# Patient Record
Sex: Female | Born: 1975 | Race: White | Hispanic: No | Marital: Married | Smoking: Former smoker
Health system: Southern US, Community
[De-identification: ages and names within clinical notes are randomized; demographics above are authoritative.]

## PROBLEM LIST (undated history)

## (undated) DIAGNOSIS — F329 Major depressive disorder, single episode, unspecified: Secondary | ICD-10-CM

## (undated) DIAGNOSIS — F32A Depression, unspecified: Secondary | ICD-10-CM

## (undated) DIAGNOSIS — I10 Essential (primary) hypertension: Secondary | ICD-10-CM

## (undated) DIAGNOSIS — E785 Hyperlipidemia, unspecified: Secondary | ICD-10-CM

## (undated) HISTORY — PX: TUBAL LIGATION: SHX77

## (undated) HISTORY — PX: CHOLECYSTECTOMY: SHX55

---

## 2009-05-24 ENCOUNTER — Emergency Department (HOSPITAL_COMMUNITY): Admission: EM | Admit: 2009-05-24 | Discharge: 2009-05-24 | Payer: Self-pay | Admitting: Emergency Medicine

## 2009-05-26 ENCOUNTER — Emergency Department (HOSPITAL_COMMUNITY): Admission: EM | Admit: 2009-05-26 | Discharge: 2009-05-26 | Payer: Self-pay | Admitting: Emergency Medicine

## 2009-12-21 ENCOUNTER — Emergency Department (HOSPITAL_COMMUNITY): Admission: EM | Admit: 2009-12-21 | Discharge: 2009-12-21 | Payer: Self-pay | Admitting: Emergency Medicine

## 2010-01-29 ENCOUNTER — Emergency Department (HOSPITAL_COMMUNITY): Admission: EM | Admit: 2010-01-29 | Discharge: 2010-01-29 | Payer: Self-pay | Admitting: Emergency Medicine

## 2010-03-09 ENCOUNTER — Emergency Department (HOSPITAL_COMMUNITY): Admission: EM | Admit: 2010-03-09 | Discharge: 2010-03-09 | Payer: Self-pay | Admitting: Emergency Medicine

## 2010-04-16 ENCOUNTER — Emergency Department (HOSPITAL_COMMUNITY)
Admission: EM | Admit: 2010-04-16 | Discharge: 2010-04-16 | Payer: Self-pay | Source: Home / Self Care | Admitting: Emergency Medicine

## 2010-04-25 LAB — BASIC METABOLIC PANEL
BUN: 9 mg/dL (ref 6–23)
CO2: 27 mEq/L (ref 19–32)
Calcium: 9.8 mg/dL (ref 8.4–10.5)
Chloride: 102 mEq/L (ref 96–112)
Creatinine, Ser: 0.86 mg/dL (ref 0.4–1.2)
GFR calc Af Amer: >60 mL/min (ref >60)
GFR calc non Af Amer: >60 mL/min (ref >60)
Glucose, Bld: 117 mg/dL — ABNORMAL HIGH (ref 70–99)
Potassium: 4.2 mEq/L (ref 3.5–5.1)
Sodium: 138 mEq/L (ref 135–145)

## 2010-04-25 LAB — URINALYSIS, ROUTINE W REFLEX MICROSCOPIC
Bilirubin Urine: NEGATIVE
Ketones, ur: NEGATIVE mg/dL
Leukocytes, UA: NEGATIVE
Nitrite: NEGATIVE
Protein, ur: NEGATIVE mg/dL
Specific Gravity, Urine: 1.01 (ref 1.005–1.030)
Urine Glucose, Fasting: NEGATIVE mg/dL
Urobilinogen, UA: 0.2 mg/dL (ref 0.0–1.0)
pH: 5.5 (ref 5.0–8.0)

## 2010-04-25 LAB — DIFFERENTIAL
Basophils Absolute: 0 10*3/uL (ref 0.0–0.1)
Basophils Relative: 0 % (ref 0–1)
Eosinophils Absolute: 0.4 10*3/uL (ref 0.0–0.7)
Eosinophils Relative: 4 % (ref 0–5)
Lymphocytes Relative: 30 % (ref 12–46)
Lymphs Abs: 3.3 10*3/uL (ref 0.7–4.0)
Monocytes Absolute: 0.6 10*3/uL (ref 0.1–1.0)
Monocytes Relative: 6 % (ref 3–12)
Neutro Abs: 6.9 10*3/uL (ref 1.7–7.7)
Neutrophils Relative %: 61 % (ref 43–77)

## 2010-04-25 LAB — CBC
HCT: 42.8 % (ref 36.0–46.0)
Hemoglobin: 14.1 g/dL (ref 12.0–15.0)
MCH: 29.5 pg (ref 26.0–34.0)
MCHC: 32.9 g/dL (ref 30.0–36.0)
MCV: 89.5 fL (ref 78.0–100.0)
Platelets: 231 10*3/uL (ref 150–400)
RBC: 4.78 MIL/uL (ref 3.87–5.11)
RDW: 12.7 % (ref 11.5–15.5)
WBC: 11.2 10*3/uL — ABNORMAL HIGH (ref 4.0–10.5)

## 2010-04-25 LAB — URINE MICROSCOPIC-ADD ON

## 2010-06-30 LAB — GLUCOSE, CAPILLARY: Glucose-Capillary: 190 mg/dL — ABNORMAL HIGH (ref 70–99)

## 2010-09-25 ENCOUNTER — Emergency Department (HOSPITAL_COMMUNITY)
Admission: EM | Admit: 2010-09-25 | Discharge: 2010-09-26 | Disposition: A | Discharge status: Home / Self Care | Payer: Managed Care, Other (non HMO) | Attending: Emergency Medicine | Admitting: Emergency Medicine

## 2010-09-25 DIAGNOSIS — K029 Dental caries, unspecified: Secondary | ICD-10-CM | POA: Insufficient documentation

## 2010-09-25 DIAGNOSIS — K089 Disorder of teeth and supporting structures, unspecified: Secondary | ICD-10-CM | POA: Insufficient documentation

## 2010-09-25 DIAGNOSIS — E119 Type 2 diabetes mellitus without complications: Secondary | ICD-10-CM | POA: Insufficient documentation

## 2010-12-25 ENCOUNTER — Emergency Department (HOSPITAL_COMMUNITY)
Admission: EM | Admit: 2010-12-25 | Discharge: 2010-12-25 | Disposition: A | Discharge status: Home / Self Care | Payer: Managed Care, Other (non HMO) | Attending: Emergency Medicine | Admitting: Emergency Medicine

## 2010-12-25 DIAGNOSIS — E119 Type 2 diabetes mellitus without complications: Secondary | ICD-10-CM | POA: Insufficient documentation

## 2010-12-25 DIAGNOSIS — Z79899 Other long term (current) drug therapy: Secondary | ICD-10-CM | POA: Insufficient documentation

## 2010-12-25 DIAGNOSIS — I10 Essential (primary) hypertension: Secondary | ICD-10-CM | POA: Insufficient documentation

## 2010-12-25 DIAGNOSIS — R51 Headache: Secondary | ICD-10-CM | POA: Insufficient documentation

## 2010-12-25 LAB — GLUCOSE, CAPILLARY: Glucose-Capillary: 190 mg/dL — ABNORMAL HIGH (ref 70–99)

## 2011-08-09 ENCOUNTER — Emergency Department (HOSPITAL_COMMUNITY)
Admission: EM | Admit: 2011-08-09 | Discharge: 2011-08-09 | Disposition: A | Discharge status: Home / Self Care | Payer: Managed Care, Other (non HMO) | Attending: Emergency Medicine | Admitting: Emergency Medicine

## 2011-08-09 ENCOUNTER — Encounter (HOSPITAL_COMMUNITY): Payer: Self-pay

## 2011-08-09 DIAGNOSIS — R21 Rash and other nonspecific skin eruption: Secondary | ICD-10-CM

## 2011-08-09 HISTORY — DX: Depression, unspecified: F32.A

## 2011-08-09 HISTORY — DX: Major depressive disorder, single episode, unspecified: F32.9

## 2011-08-09 MED ORDER — PREDNISONE (PAK) 10 MG PO TABS: 10.0000 mg | ORAL_TABLET | Freq: Every day | ORAL | Status: AC

## 2011-08-09 MED ORDER — FAMOTIDINE 20 MG PO TABS: 40.0000 mg | ORAL_TABLET | Freq: Two times a day (BID) | ORAL | Status: DC

## 2011-08-09 MED ORDER — TETANUS-DIPHTH-ACELL PERTUSSIS 5-2.5-18.5 LF-MCG/0.5 IM SUSP
0.5000 mL | Freq: Once | INTRAMUSCULAR | Status: AC
  Administered 2011-08-09: 0.5 mL via INTRAMUSCULAR
  Filled 2011-08-09: qty 0.5

## 2011-08-09 MED ORDER — SULFAMETHOXAZOLE-TRIMETHOPRIM 800-160 MG PO TABS: 1.0000 | ORAL_TABLET | Freq: Two times a day (BID) | ORAL | Status: AC

## 2011-08-09 MED ORDER — FAMOTIDINE 20 MG PO TABS
40.0000 mg | ORAL_TABLET | Freq: Once | ORAL | Status: AC
  Administered 2011-08-09: 40 mg via ORAL
  Filled 2011-08-09: qty 2

## 2011-08-09 MED ORDER — DIPHENHYDRAMINE HCL 25 MG PO CAPS
50.0000 mg | ORAL_CAPSULE | Freq: Once | ORAL | Status: AC
  Administered 2011-08-09: 50 mg via ORAL
  Filled 2011-08-09: qty 2

## 2011-08-09 MED ORDER — PREDNISONE 20 MG PO TABS
60.0000 mg | ORAL_TABLET | Freq: Once | ORAL | Status: AC
  Administered 2011-08-09: 60 mg via ORAL
  Filled 2011-08-09: qty 3

## 2011-08-09 MED ORDER — DIPHENHYDRAMINE HCL 25 MG PO TABS: 25.0000 mg | ORAL_TABLET | Freq: Four times a day (QID) | ORAL | Status: DC

## 2011-08-09 NOTE — ED Provider Notes (Signed)
Medical screening examination/treatment/procedure(s) were performed by non-physician practitioner and as supervising physician I was immediately available for consultation/collaboration.  Meital Riehl, MD 08/09/11 2349 

## 2011-08-09 NOTE — ED Provider Notes (Signed)
History     CSN: [id number]  Arrival date & time 08/09/11  1851   First MD Initiated Contact with Patient 08/09/11 2113      Chief Complaint  Patient presents with  . Urticaria    (Consider location/radiation/quality/duration/timing/severity/associated sxs/prior treatment) HPI  36 year old female presents with complaints of urticarial rash.  Pt sts for the past 5 days she has been developing and red itchy rash to both of her arms.  Describe onset as gradual, persistent, and now spreading towards upper chest.  Denies any changes in activity, medication, detergents, travels, or swimming in hot tub.  Itch improves with benadryl.  Denies fever, headache, sore throat, trouble breathing, sob, n/v/d, abd pain.  Has had chicken pox in the past.  UTD with all immunization.  No recent travel.    Past Medical History  Diagnosis Date  . Diabetes mellitus   . Depression     Past Surgical History  Procedure Date  . Cholecystectomy     No family history on file.  History  Substance Use Topics  . Smoking status: Current Everyday Smoker  . Smokeless tobacco: Not on file  . Alcohol Use: No    OB History    Grav Para Term Preterm Abortions TAB SAB Ect Mult Living                  Review of Systems  All other systems reviewed and are negative.    Allergies  Penicillins  Home Medications   Current Outpatient Rx  Name Route Sig Dispense Refill  . DIPHENHYDRAMINE HCL 25 MG PO CAPS Oral Take 100 mg by mouth every 6 (six) hours as needed. For allergy    . IBUPROFEN 200 MG PO TABS Oral Take 800 mg by mouth every 6 (six) hours as needed. For pain    . SERTRALINE HCL 100 MG PO TABS Oral Take 100 mg by mouth at bedtime.      BP 150/93  Pulse 104  Temp(Src) 98.3 F (36.8 C) (Oral)  Resp 18  SpO2 97%  LMP 08/09/2011  Physical Exam  Constitutional: She appears well-developed and well-nourished. No distress.  HENT:  Head: Atraumatic.  Mouth/Throat: Oropharynx is clear and  moist.  Eyes: Conjunctivae are normal.  Neck: Neck supple.  Cardiovascular: Normal rate and regular rhythm.   Pulmonary/Chest: Effort normal. No respiratory distress. She has no wheezes. She exhibits no tenderness.  Abdominal: Soft. There is no tenderness.  Lymphadenopathy:    She has no cervical adenopathy.  Neurological: She is alert.  Skin: Skin is warm.       Multiple maculopapular rash throughout dorsum of arms bilaterally and upper chest.  Nonpetechiae, nonpustular, nonvesicular.  Blanchable.    Psychiatric: She has a normal mood and affect.    ED Course  Procedures (including critical care time)  Labs Reviewed - No data to display No results found.   No diagnosis found.    MDM  Rash of unknown origin.  No systemic involvement.  Plan to give bactrim to prevent secondary skin infection, prednisone/benadryl/pepcid for symptomatic treatment.  Referral to dermatology.  Pt voice understanding.  Tdap given.          Fayrene Helper, PA-C 08/09/11 2127

## 2011-08-09 NOTE — Discharge Instructions (Signed)
Rash A rash is a change in the color or texture of your skin. There are many different types of rashes. You may have other problems that accompany your rash. CAUSES   Infections.   Allergic reactions. This can include allergies to pets or foods.   Certain medicines.   Exposure to certain chemicals, soaps, or cosmetics.   Heat.   Exposure to poisonous plants.   Tumors, both cancerous and noncancerous.  SYMPTOMS   Redness.   Scaly skin.   Itchy skin.   Dry or cracked skin.   Bumps.   Blisters.   Pain.  DIAGNOSIS  Your caregiver may do a physical exam to determine what type of rash you have. A skin sample (biopsy) may be taken and examined under a microscope. TREATMENT  Treatment depends on the type of rash you have. Your caregiver may prescribe certain medicines. For serious conditions, you may need to see a skin doctor (dermatologist). HOME CARE INSTRUCTIONS   Avoid the substance that caused your rash.   Do not scratch your rash. This can cause infection.   You may take cool baths to help stop itching.   Only take over-the-counter or prescription medicines as directed by your caregiver.   Keep all follow-up appointments as directed by your caregiver.  SEEK IMMEDIATE MEDICAL CARE IF:  You have increasing pain, swelling, or redness.   You have a fever.   You have new or severe symptoms.   You have body aches, diarrhea, or vomiting.   Your rash is not better after 3 days.  MAKE SURE YOU:  Understand these instructions.   Will watch your condition.   Will get help right away if you are not doing well or get worse.  Document Released: 03/17/2002 Document Revised: 03/16/2011 Document Reviewed: 01/09/2011 [employer] Patient Information 2012 [employer]. 

## 2011-08-09 NOTE — ED Notes (Signed)
Pt. Developed red areas  On both of her arms, i they are sore and itchy, has not changed in her ADLS

## 2011-09-02 ENCOUNTER — Emergency Department (HOSPITAL_COMMUNITY)
Admission: EM | Admit: 2011-09-02 | Discharge: 2011-09-02 | Disposition: A | Discharge status: Home / Self Care | Payer: Managed Care, Other (non HMO) | Attending: Emergency Medicine | Admitting: Emergency Medicine

## 2011-09-02 ENCOUNTER — Emergency Department (HOSPITAL_COMMUNITY): Payer: Managed Care, Other (non HMO)

## 2011-09-02 ENCOUNTER — Encounter (HOSPITAL_COMMUNITY): Payer: Self-pay | Admitting: Emergency Medicine

## 2011-09-02 DIAGNOSIS — E1169 Type 2 diabetes mellitus with other specified complication: Secondary | ICD-10-CM | POA: Insufficient documentation

## 2011-09-02 DIAGNOSIS — G5601 Carpal tunnel syndrome, right upper limb: Secondary | ICD-10-CM

## 2011-09-02 DIAGNOSIS — R739 Hyperglycemia, unspecified: Secondary | ICD-10-CM

## 2011-09-02 DIAGNOSIS — G5600 Carpal tunnel syndrome, unspecified upper limb: Secondary | ICD-10-CM | POA: Insufficient documentation

## 2011-09-02 DIAGNOSIS — F3289 Other specified depressive episodes: Secondary | ICD-10-CM | POA: Insufficient documentation

## 2011-09-02 DIAGNOSIS — F329 Major depressive disorder, single episode, unspecified: Secondary | ICD-10-CM | POA: Insufficient documentation

## 2011-09-02 LAB — POCT I-STAT, CHEM 8
BUN: 11 mg/dL (ref 6–23)
Calcium, Ion: 1.22 mmol/L (ref 1.12–1.32)
Chloride: 101 mEq/L (ref 96–112)
Creatinine, Ser: 0.8 mg/dL (ref 0.50–1.10)
Glucose, Bld: 359 mg/dL — ABNORMAL HIGH (ref 70–99)
HCT: 44 % (ref 36.0–46.0)
Hemoglobin: 15 g/dL (ref 12.0–15.0)
Potassium: 4.3 mEq/L (ref 3.5–5.1)
Sodium: 136 mEq/L (ref 135–145)
TCO2: 25 mmol/L (ref 0–100)

## 2011-09-02 LAB — GLUCOSE, CAPILLARY: Glucose-Capillary: 331 mg/dL — ABNORMAL HIGH (ref 70–99)

## 2011-09-02 MED ORDER — OXYCODONE-ACETAMINOPHEN 5-325 MG PO TABS
2.0000 | ORAL_TABLET | Freq: Once | ORAL | Status: AC
  Administered 2011-09-02: 2 via ORAL
  Filled 2011-09-02: qty 2

## 2011-09-02 MED ORDER — METFORMIN HCL 1000 MG PO TABS: 1000.0000 mg | ORAL_TABLET | Freq: Two times a day (BID) | ORAL | Status: DC

## 2011-09-02 MED ORDER — HYDROCODONE-ACETAMINOPHEN 5-325 MG PO TABS: 1.0000 | ORAL_TABLET | ORAL | Status: AC | PRN

## 2011-09-02 NOTE — ED Notes (Signed)
Patient is alert and oriented x3.  She was given DC instructions and follow up visit instructions.  Patient gave verbal understanding. She was DC ambulatory under his own power to home.  V/S stable.  He was not showing any signs of distress on DC 

## 2011-09-02 NOTE — ED Notes (Addendum)
Pt presents with right hand swelling and pain (9/10). Able to lift fingers to a neutral position, but unable to touch fingers to thumb. Started 0630 at 09/01/11. States she was just watching TV, fell asleep, and woke up with pain in her hand. Pain upon moving of wrist. No pain moving elbow.

## 2011-09-02 NOTE — ED Provider Notes (Signed)
History     CSN: [id number]  Arrival date & time 09/02/11  0145   First MD Initiated Contact with Patient 09/02/11 [id number]      Chief Complaint  Patient presents with  . Arm Pain    HPI  History provided by the patient. Patient is a 36 year old female with history of diabetes and depression who presents with complaints of right hand and wrist pain that began yesterday morning. Patient states the pain woke her up from sleep. She tried to make it through the the day with pain but states pain was too severe. She tried taking Tylenol without improvement. Pain is worse with any movements of the wrist or thumb area. Denies any, or injury. She denies other symptoms previously. Patient does report that she has a lot of typing on the computer for work and hobbies. She denies any numbness or weakness to the finger. Patient also states that she is currently without PCP has been off metformin for a month or longer request prescription for refill.    Past Medical History  Diagnosis Date  . Diabetes mellitus   . Depression     Past Surgical History  Procedure Date  . Cholecystectomy   . Tubal ligation     History reviewed. No pertinent family history.  History  Substance Use Topics  . Smoking status: Current Everyday Smoker  . Smokeless tobacco: Not on file  . Alcohol Use: No    OB History    Grav Para Term Preterm Abortions TAB SAB Ect Mult Living                  Review of Systems  Constitutional: Negative for fever and chills.  HENT: Negative for neck pain.   Musculoskeletal: Positive for joint swelling.  Skin: Negative for rash.  Neurological: Negative for weakness and numbness.    Allergies  Penicillins  Home Medications   Current Outpatient Rx  Name Route Sig Dispense Refill  . DIPHENHYDRAMINE HCL 25 MG PO CAPS Oral Take 50 mg by mouth every 6 (six) hours as needed. For allergy    . SERTRALINE HCL 100 MG PO TABS Oral Take 100 mg by mouth at bedtime.      BP  127/78  Pulse 111  Temp(Src) 98.4 F (36.9 C) (Oral)  Resp 16  SpO2 100%  LMP 08/09/2011  Physical Exam  Nursing note and vitals reviewed. Constitutional: She is oriented to person, place, and time. She appears well-developed and well-nourished. No distress.  HENT:  Head: Normocephalic.  Cardiovascular: Normal rate and regular rhythm.   Pulmonary/Chest: Effort normal and breath sounds normal.  Abdominal: Soft.  Musculoskeletal:       Right hand appears normal without any noticeable swelling or deformity. She has pain palpation over the extensor tendon of thumb.patient also has reduced range of motion of thumb and wrist area. There is pain to palpation over the anterior wrist with positive Tinel sign. Patient also has positive phalanx sign.  Neurological: She is alert and oriented to person, place, and time.  Skin: Skin is warm and dry. No rash noted.  Psychiatric: She has a normal mood and affect. Her behavior is normal.    ED Course  Procedures   Results for orders placed during the hospital encounter of 09/02/11  GLUCOSE, CAPILLARY      Component Value Range   Glucose-Capillary 331 (*) 70 - 99 (mg/dL)   Comment 1 Notify RN    POCT I-STAT, CHEM 8  Component Value Range   Sodium 136  135 - 145 (mEq/L)   Potassium 4.3  3.5 - 5.1 (mEq/L)   Chloride 101  96 - 112 (mEq/L)   BUN 11  6 - 23 (mg/dL)   Creatinine, Ser 1.61  0.50 - 1.10 (mg/dL)   Glucose, Bld 096 (*) 70 - 99 (mg/dL)   Calcium, Ion 0.45  4.09 - 1.32 (mmol/L)   TCO2 25  0 - 100 (mmol/L)   Hemoglobin 15.0  12.0 - 15.0 (g/dL)   HCT 81.1  91.4 - 78.2 (%)      Dg Wrist Complete Right  09/02/2011  *RADIOLOGY REPORT*  Clinical Data: Pain.  RIGHT WRIST - COMPLETE 3+ VIEW  Comparison: None.  Findings: Right wrist appears intact. Old ununited ossicle over the radial styloid process.  No evidence of acute fracture or subluxation.  Small exostosis arising from the lateral radial metaphysis.  No bone destruction or  erosion.  Bone cortex and trabecular architecture appear intact.  No radiopaque soft tissue foreign bodies.  IMPRESSION: No acute bony abnormalities.  Original Report Authenticated By: Marlon Pel, M.D.   Dg Hand Complete Right  09/02/2011  *RADIOLOGY REPORT*  Clinical Data: Severe right wrist and hand pain.  No known trauma.  RIGHT HAND - COMPLETE 3+ VIEW  Comparison: None.  Findings: The right hand appears intact.  No evidence of acute fracture or subluxation.  No focal bone lesion or bone destruction. Bone cortex and trabecular architecture appear intact.  No radiopaque soft tissue foreign bodies.  IMPRESSION: No acute bony abnormalities.  Original Report Authenticated By: Marlon Pel, M.D.     1. Carpal tunnel syndrome of right wrist   2. Hyperglycemia       MDM  3:40 AM patient seen and evaluated. Patient in no acute distress.   No significant findings on x-ray. Patient does perform a lot of typing activities per her history. Symptoms may be related to carpal tunnel syndrome versus de Quervain's tenosynovitis as patient is tender along the extensor tendon of first digit. Patient is also tenderness over anterior medial wrist with reduced range of motion. There is no swelling or change of the skin.  Patient instructed on diagnosis and treatment. We'll also provide prescription for metformin for her normal dosage for diabetes.     Angus Seller, [place] 09/02/11 2016

## 2011-09-02 NOTE — ED Notes (Signed)
Pt presented to the ER with c/o right arm pain, states was woken up yesterday morning by pain, 8/10, pt states that she only took [employer]s pain reliver and that denies any relief from it. Pt states that pain is sever and it feels "like someone is about to rip my arm out"

## 2011-09-02 NOTE — Discharge Instructions (Signed)
You were seen and evaluated for your right wrist and hand pains. At this time your providers feel your symptoms may be caused by carpal tunnel syndrome. Given placed in a splint to help rest her hand. Use ice also to help reduce swelling and pain.you also found to have elevated blood sugar. Your given new prescriptions for metformin to continue taking.  Please followup with a primary care provider for continued evaluation and treatment.  Carpal Tunnel Syndrome The carpal tunnel is a narrow hollow area in the wrist. It is formed by the wrist bones and ligaments. Nerves, blood vessels, and tendons (cord like structures which attach muscle to bone) on the palm side (the side of your hand in the direction your fingers bend) of your hand pass through the carpal tunnel. Repeated wrist motion or certain diseases may cause swelling within the tunnel. (That is why these are called repetitive trauma (damage caused by over use) disorders. It is also a common problem in late pregnancy.) This swelling pinches the main nerve in the wrist (median nerve) and causes the painful condition called carpal tunnel syndrome. A feeling of "pins and needles" may be noticed in the fingers or hand; however, the entire arm may ache from this condition. Carpal tunnel syndrome may clear up by itself. Cortisone injections may help. Sometimes, an operation may be needed to free the pinched nerve. An electromyogram (a type of test) may be needed to confirm this diagnosis (learning what is wrong). This is a test which measures nerve conduction. The nerve conduction is usually slowed in a carpal tunnel syndrome. HOME CARE INSTRUCTIONS   If your caregiver prescribed medication to help reduce swelling, take as directed.   If you were given a splint to keep your wrist from bending, use it as instructed. It is important to wear the splint at night. Use the splint for as long as you have pain or numbness in your hand, arm or wrist. This may take 1  to 2 months.   If you have pain at night, it may help to rub or shake your hand, or elevate your hand above the level of your heart (the center of your chest).   It is important to give your wrist a rest by stopping the activities that are causing the problem. If your symptoms (problems) are work-related, you may need to talk to your employer about changing to a job that does not require using your wrist.   Only take over-the-counter or prescription medicines for pain, discomfort, or fever as directed by your caregiver.   Following periods of extended use, particularly strenuous use, apply an ice pack wrapped in a towel to the anterior (palm) side of the affected wrist for 20 to 30 minutes. Repeat as needed three to four times per day. This will help reduce the swelling.   Follow all instructions for follow-up with your caregiver. This includes any orthopedic referrals, physical therapy, and rehabilitation. Any delay in obtaining necessary care could result in a delay or failure of your condition to heal.  SEEK IMMEDIATE MEDICAL CARE IF:   You are still having pain and numbness following a week of treatment.   You develop new, unexplained symptoms.   Your current symptoms are getting worse and are not helped or controlled with medications.  MAKE SURE YOU:   Understand these instructions.   Will watch your condition.   Will get help right away if you are not doing well or get worse.  Document Released: 03/24/2000  Document Revised: 03/16/2011 Document Reviewed: 02/10/2011 [facility] Patient Information 2012 [place], [place].     Hyperglycemia Hyperglycemia occurs when the glucose (sugar) in your blood is too high. Hyperglycemia can happen for many reasons, but it most often happens to people who do not know they have diabetes or are not managing their diabetes properly.  CAUSES  Whether you have diabetes or not, there are other causes of hyperglycemia. Hyperglycemia can occur when you  have diabetes, but it can also occur in other situations that you might not be as aware of, such as: Diabetes  If you have diabetes and are having problems controlling your blood glucose, hyperglycemia could occur because of some of the following reasons:   Not following your meal plan.   Not taking your diabetes medications or not taking it properly.   Exercising less or doing less activity than you normally do.   Being sick.  Pre-diabetes  This cannot be ignored. Before people develop Type 2 diabetes, they almost always have "pre-diabetes." This is when your blood glucose levels are higher than normal, but not yet high enough to be diagnosed as diabetes. Research has shown that some long-term damage to the body, especially the heart and circulatory system, may already be occurring during pre-diabetes. If you take action to manage your blood glucose when you have pre-diabetes, you may delay or prevent Type 2 diabetes from developing.  Stress  If you have diabetes, you may be "diet" controlled or on oral medications or insulin to control your diabetes. However, you may find that your blood glucose is higher than usual in the hospital whether you have diabetes or not. This is often referred to as "stress hyperglycemia." Stress can elevate your blood glucose. This happens because of hormones put out by the body during times of stress. If stress has been the cause of your high blood glucose, it can be followed regularly by your caregiver. That way he/she can make sure your hyperglycemia does not continue to get worse or progress to diabetes.  Steroids  Steroids are medications that act on the infection fighting system (immune system) to block inflammation or infection. One side effect can be a rise in blood glucose. Most people can produce enough extra insulin to allow for this rise, but for those who cannot, steroids make blood glucose levels go even higher. It is not unusual for steroid treatments  to "uncover" diabetes that is developing. It is not always possible to determine if the hyperglycemia will go away after the steroids are stopped. A special blood test called an A1c is sometimes done to determine if your blood glucose was elevated before the steroids were started.  SYMPTOMS  Thirsty.   Frequent urination.   Dry mouth.   Blurred vision.   Tired or fatigue.   Weakness.   Sleepy.   Tingling in feet or leg.  DIAGNOSIS  Diagnosis is made by monitoring blood glucose in one or all of the following ways:  A1c test. This is a chemical found in your blood.   Fingerstick blood glucose monitoring.   Laboratory results.  TREATMENT  First, knowing the cause of the hyperglycemia is important before the hyperglycemia can be treated. Treatment may include, but is not be limited to:  Education.   Change or adjustment in medications.   Change or adjustment in meal plan.   Treatment for an illness, infection, etc.   More frequent blood glucose monitoring.   Change in exercise plan.   Decreasing or  stopping steroids.   Lifestyle changes.  HOME CARE INSTRUCTIONS   Test your blood glucose as directed.   Exercise regularly. Your caregiver will give you instructions about exercise. Pre-diabetes or diabetes which comes on with stress is helped by exercising.   Eat wholesome, balanced meals. Eat often and at regular, fixed times. Your caregiver or nutritionist will give you a meal plan to guide your sugar intake.   Being at an ideal weight is important. If needed, losing as little as 10 to 15 pounds may help improve blood glucose levels.  SEEK MEDICAL CARE IF:   You have questions about medicine, activity, or diet.   You continue to have symptoms (problems such as increased thirst, urination, or weight gain).  SEEK IMMEDIATE MEDICAL CARE IF:   You are vomiting or have diarrhea.   Your breath smells fruity.   You are breathing faster or slower.   You are very  sleepy or incoherent.   You have numbness, tingling, or pain in your feet or hands.   You have chest pain.   Your symptoms get worse even though you have been following your caregiver's orders.   If you have any other questions or concerns.  Document Released: 09/20/2000 Document Revised: 03/16/2011 Document Reviewed: 11/16/2008 [facility] Patient Information 2012 [place], [place].    RESOURCE GUIDE  Dental Problems  Patients with Medicaid: [facility] [phone] [address]                                           [id number] [employer] Phone:  [phone]                                                  Phone:  [phone]  If unable to pay or uninsured, contact:  [facility] or [facility]. to become qualified for the adult dental clinic.  Chronic Pain Problems Contact [facility]  [phone] Patients need to be referred by their primary care doctor.  Insufficient Money for Medicine Contact [employer]:  call "211" or [facility] [phone].  No Primary Care Doctor Call [facility]  312-676-4728 Other agencies that provide inexpensive medical care    [facility]  [phone]    [facility]  [phone]    [facility]  [phone]    [facility]  [phone]    [facility]  ([phone]    [facility]  [phone]  Psychological Services [facility]  [phone] [facility] Services  [phone] [facility]   [phone] (emergency services (726)549-2719)  Substance Abuse Resources [facility]  734-821-6521 [facility] 331-652-7965 [facility] 778-699-7714 Floydene Flock (956)170-1448 Residential & Outpatient Substance Abuse Program  952-634-1390  Abuse/Neglect [facility] Child Abuse Hotline 515-562-5323 [facility] Child Abuse Hotline (812)802-7572 (After Hours)  Emergency  Shelter [facility] (607) [phone]  M[facility] [facility] [phone])402-4231 [facility] 516-556-9370  MRSA Hotline #:   (401)629-4230    [facility]     [employer]                          [facility] Dept. [address] [place]                       [address]      [address]  Blondell Reveal Phone:  [phone]                                   Phone:  [phone]                 Phone:  [phone]  [facility] Phone:  [phone]  [facility] Child Abuse Hotline (315) 562-0774 507-228-0741 (After Hours)

## 2011-09-03 NOTE — ED Provider Notes (Signed)
Medical screening examination/treatment/procedure(s) were performed by non-physician practitioner and as supervising physician I was immediately available for consultation/collaboration.   Santita Hunsberger L Tamberlyn Midgley, MD 09/03/11 0517 

## 2012-08-02 DIAGNOSIS — E119 Type 2 diabetes mellitus without complications: Secondary | ICD-10-CM | POA: Insufficient documentation

## 2012-11-30 ENCOUNTER — Emergency Department (HOSPITAL_COMMUNITY): Payer: Managed Care, Other (non HMO)

## 2012-11-30 ENCOUNTER — Emergency Department (HOSPITAL_COMMUNITY)
Admission: EM | Admit: 2012-11-30 | Discharge: 2012-11-30 | Disposition: A | Discharge status: Home / Self Care | Payer: Managed Care, Other (non HMO) | Attending: Emergency Medicine | Admitting: Emergency Medicine

## 2012-11-30 ENCOUNTER — Encounter (HOSPITAL_COMMUNITY): Payer: Self-pay | Admitting: *Deleted

## 2012-11-30 DIAGNOSIS — I10 Essential (primary) hypertension: Secondary | ICD-10-CM | POA: Insufficient documentation

## 2012-11-30 DIAGNOSIS — F17200 Nicotine dependence, unspecified, uncomplicated: Secondary | ICD-10-CM | POA: Insufficient documentation

## 2012-11-30 DIAGNOSIS — Z79899 Other long term (current) drug therapy: Secondary | ICD-10-CM | POA: Insufficient documentation

## 2012-11-30 DIAGNOSIS — E119 Type 2 diabetes mellitus without complications: Secondary | ICD-10-CM | POA: Insufficient documentation

## 2012-11-30 DIAGNOSIS — Z880 Allergy status to penicillin: Secondary | ICD-10-CM | POA: Insufficient documentation

## 2012-11-30 DIAGNOSIS — Z8639 Personal history of other endocrine, nutritional and metabolic disease: Secondary | ICD-10-CM | POA: Insufficient documentation

## 2012-11-30 DIAGNOSIS — Z862 Personal history of diseases of the blood and blood-forming organs and certain disorders involving the immune mechanism: Secondary | ICD-10-CM | POA: Insufficient documentation

## 2012-11-30 DIAGNOSIS — G8929 Other chronic pain: Secondary | ICD-10-CM | POA: Insufficient documentation

## 2012-11-30 DIAGNOSIS — R269 Unspecified abnormalities of gait and mobility: Secondary | ICD-10-CM | POA: Insufficient documentation

## 2012-11-30 DIAGNOSIS — F3289 Other specified depressive episodes: Secondary | ICD-10-CM | POA: Insufficient documentation

## 2012-11-30 DIAGNOSIS — F329 Major depressive disorder, single episode, unspecified: Secondary | ICD-10-CM | POA: Insufficient documentation

## 2012-11-30 DIAGNOSIS — M722 Plantar fascial fibromatosis: Secondary | ICD-10-CM | POA: Insufficient documentation

## 2012-11-30 HISTORY — DX: Essential (primary) hypertension: I10

## 2012-11-30 HISTORY — DX: Hyperlipidemia, unspecified: E78.5

## 2012-11-30 MED ORDER — TRAMADOL HCL 50 MG PO TABS
50.0000 mg | ORAL_TABLET | Freq: Once | ORAL | Status: AC
  Administered 2012-11-30: 50 mg via ORAL
  Filled 2012-11-30: qty 1

## 2012-11-30 MED ORDER — TRAMADOL HCL 50 MG PO TABS: 50.0000 mg | ORAL_TABLET | Freq: Three times a day (TID) | ORAL | Status: DC | PRN

## 2012-11-30 NOTE — ED Provider Notes (Signed)
CSN: [id number]     Arrival date & time 11/30/12  0456 History     First MD Initiated Contact with Patient 11/30/12 [phone]     Chief Complaint  Patient presents with  . Foot Pain   (Consider location/radiation/quality/duration/timing/severity/associated sxs/prior Treatment) HPI Comments: Robin Salinas is a 37 year old morbidly obese, female, with a history of chronic left foot pain.  That is getting worse.  She said normally.  The pain is from the mid foot, to the heel, but now is extending to the base of her great toes and radiating up her leg.  Worse with ambulation, but never really goes away.  She denies any trauma, that she is aware of she is a non-insulin-dependent diabetic, but is poorly controlled.  They've recently increased her medication to 1000 mg metformin twice a day  Patient is a 37 y.o. female presenting with lower extremity pain. The history is provided by the patient.  Foot Pain This is a chronic problem. The current episode started more than 1 year ago. The problem occurs constantly. The problem has been gradually worsening. Associated symptoms include arthralgias. Pertinent negatives include no fever or rash. The symptoms are aggravated by walking. She has tried acetaminophen, NSAIDs and walking for the symptoms. The treatment provided no relief.    Past Medical History  Diagnosis Date  . Diabetes mellitus   . Depression   . Hypertension   . Hyperlipidemia    Past Surgical History  Procedure Laterality Date  . Cholecystectomy    . Tubal ligation     No family history on file. History  Substance Use Topics  . Smoking status: Current Every Day Smoker -- 0.50 packs/day  . Smokeless tobacco: Not on file  . Alcohol Use: No   OB History   Grav Para Term Preterm Abortions TAB SAB Ect Mult Living                 Review of Systems  Constitutional: Negative for fever.  Musculoskeletal: Positive for arthralgias and gait problem.  Skin: Negative for color change,  rash and wound.  All other systems reviewed and are negative.    Allergies  Penicillins  Home Medications   Current Outpatient Rx  Name  Route  Sig  Dispense  Refill  . diphenhydrAMINE (BENADRYL) 25 mg capsule   Oral   Take 50 mg by mouth every 6 (six) hours as needed. For allergy         . EXPIRED: metFORMIN (GLUCOPHAGE) 1000 MG tablet   Oral   Take 1 tablet (1,000 mg total) by mouth 2 (two) times daily.   60 tablet   0   . sertraline (ZOLOFT) 100 MG tablet   Oral   Take 100 mg by mouth at bedtime.         . traMADol (ULTRAM) 50 MG tablet   Oral   Take 1 tablet (50 mg total) by mouth every 8 (eight) hours as needed for pain.   30 tablet   0    BP 127/72  Pulse 96  Temp(Src) 99 F (37.2 C) (Oral)  Resp 18  SpO2 99%  LMP 11/12/2012 Physical Exam  Nursing note and vitals reviewed. Constitutional: She is oriented to person, place, and time. She appears well-developed and well-nourished.  HENT:  Head: Normocephalic.  Eyes: Pupils are equal, round, and reactive to light.  Neck: Normal range of motion.  Cardiovascular: Normal rate and regular rhythm.   Musculoskeletal: She exhibits tenderness. She exhibits no edema.  Left foot: She exhibits tenderness. She exhibits normal range of motion, no swelling, no crepitus and no deformity.       Feet:  pain  Neurological: She is alert and oriented to person, place, and time.  Skin: Skin is warm and dry. No rash noted. No erythema.    ED Course   Procedures (including critical care time)  Labs Reviewed - No data to display Dg Foot Complete Left  11/30/2012   *RADIOLOGY REPORT*  Clinical Data: Pain over the bottom of the foot for 1 year.  LEFT FOOT - COMPLETE 3+ VIEW  Comparison: None.  Findings: Left foot appears intact. No evidence of acute fracture or subluxation.  No focal bone lesions.  Bone matrix and cortex appear intact.  No abnormal radiopaque densities in the soft tissues.  IMPRESSION: No acute bony  abnormalities demonstrated in the left foot.   Original Report Authenticated By: Burman Nieves, M.D.   1. Plantar fasciitis of left foot     MDM  Extra reviewed.  No pathology noted.  We'll place patient in a Cam Walker prescribed Ultram for pain, and refer patient to podiatry for further evaluation.  Symptoms are consistent with a plantar fasciitis are reluctant to start the steroid at this time.  Due to patient's poorly controlled diabetes   Arman Filter, NP 11/30/12 [phone]

## 2012-11-30 NOTE — ED Notes (Signed)
Pt states her left foot hurts like a stone bruise and it has for "quite a while"  And the pain is unbearable   Pt is alert and oriented in NAD,  Pt drove herself to ED

## 2012-11-30 NOTE — ED Provider Notes (Signed)
Medical screening examination/treatment/procedure(s) were performed by non-physician practitioner and as supervising physician I was immediately available for consultation/collaboration.   Tayten Bergdoll M Ica Daye, MD 11/30/12 0736 

## 2013-02-25 ENCOUNTER — Encounter (HOSPITAL_COMMUNITY): Payer: Self-pay | Admitting: Emergency Medicine

## 2013-02-25 ENCOUNTER — Emergency Department (HOSPITAL_COMMUNITY)
Admission: EM | Admit: 2013-02-25 | Discharge: 2013-02-25 | Disposition: A | Discharge status: Home / Self Care | Payer: Managed Care, Other (non HMO) | Attending: Emergency Medicine | Admitting: Emergency Medicine

## 2013-02-25 DIAGNOSIS — F3289 Other specified depressive episodes: Secondary | ICD-10-CM | POA: Insufficient documentation

## 2013-02-25 DIAGNOSIS — R42 Dizziness and giddiness: Secondary | ICD-10-CM | POA: Insufficient documentation

## 2013-02-25 DIAGNOSIS — F17200 Nicotine dependence, unspecified, uncomplicated: Secondary | ICD-10-CM | POA: Insufficient documentation

## 2013-02-25 DIAGNOSIS — E785 Hyperlipidemia, unspecified: Secondary | ICD-10-CM | POA: Insufficient documentation

## 2013-02-25 DIAGNOSIS — Z880 Allergy status to penicillin: Secondary | ICD-10-CM | POA: Insufficient documentation

## 2013-02-25 DIAGNOSIS — E119 Type 2 diabetes mellitus without complications: Secondary | ICD-10-CM | POA: Insufficient documentation

## 2013-02-25 DIAGNOSIS — H9201 Otalgia, right ear: Secondary | ICD-10-CM

## 2013-02-25 DIAGNOSIS — Z79899 Other long term (current) drug therapy: Secondary | ICD-10-CM | POA: Insufficient documentation

## 2013-02-25 DIAGNOSIS — M542 Cervicalgia: Secondary | ICD-10-CM | POA: Insufficient documentation

## 2013-02-25 DIAGNOSIS — H9209 Otalgia, unspecified ear: Secondary | ICD-10-CM | POA: Insufficient documentation

## 2013-02-25 DIAGNOSIS — I10 Essential (primary) hypertension: Secondary | ICD-10-CM | POA: Insufficient documentation

## 2013-02-25 DIAGNOSIS — F329 Major depressive disorder, single episode, unspecified: Secondary | ICD-10-CM | POA: Insufficient documentation

## 2013-02-25 NOTE — ED Provider Notes (Signed)
CSN: [id number]     Arrival date & time 02/25/13  1356 History  This chart was scribed for Robin Ceo, PA-C, working with Gwyneth Sprout, MD by Blanchard Kelch, ED Scribe. This patient was seen in room WTR8/WTR8 and the patient's care was started at 3:50 PM.    Chief Complaint  Patient presents with  . Otalgia    r/ear pain x 2 weeks    Patient is a 37 y.o. female presenting with ear pain. The history is provided by the patient. No language interpreter was used.  Otalgia Associated symptoms: neck pain   Associated symptoms: no ear discharge, no fever, no headaches, no hearing loss and no tinnitus     HPI Comments: Robin Salinas is a 37 y.o. female with a history of diabetes, hypertension and hyperlipidemia who presents to the Emergency Department complaining of constant right ear pain that began two weeks ago. The pain radiates down her throat. She has associated intermittent vertigo however denies this currently. She states that she was seen by someone for her vertigo who told her it may be due to elevated blood sugar levels. She has been using Ibuprofen without relief. In the past few days she has been putting hydrogen peroxide in the ear with temporary relief for about five hours. She denies fever, pain with swallowing, headache, ear drainage, hearing loss, or tinnitus.   Past Medical History  Diagnosis Date  . Diabetes mellitus   . Depression   . Hypertension   . Hyperlipidemia    Past Surgical History  Procedure Laterality Date  . Cholecystectomy    . Tubal ligation     Family History  Problem Relation Age of Onset  . Diabetes Other   . Hypertension Other    History  Substance Use Topics  . Smoking status: Current Every Day Smoker -- 0.50 packs/day    Types: Cigarettes  . Smokeless tobacco: Not on file  . Alcohol Use: Yes   OB History   Grav Para Term Preterm Abortions TAB SAB Ect Mult Living                 Review of Systems  Constitutional:  Negative for fever.  HENT: Positive for ear pain. Negative for ear discharge, hearing loss, tinnitus and trouble swallowing.   Musculoskeletal: Positive for neck pain. Negative for neck stiffness.  Neurological: Positive for dizziness. Negative for headaches.  All other systems reviewed and are negative.   Allergies  Penicillins  Home Medications   Current Outpatient Rx  Name  Route  Sig  Dispense  Refill  . diphenhydrAMINE (BENADRYL) 25 mg capsule   Oral   Take 50 mg by mouth every 6 (six) hours as needed for allergies. For allergy         . glipiZIDE (GLUCOTROL) 5 MG tablet   Oral   Take 5 mg by mouth daily before breakfast.         . ibuprofen (ADVIL,MOTRIN) 200 MG tablet   Oral   Take 400 mg by mouth every 6 (six) hours as needed for mild pain or moderate pain.          Marland Kitchen lovastatin (MEVACOR) 10 MG tablet   Oral   Take 10 mg by mouth at bedtime.         . metFORMIN (GLUCOPHAGE) 1000 MG tablet   Oral   Take 1,000 mg by mouth 2 (two) times daily.         . sertraline (ZOLOFT) 100 MG  tablet   Oral   Take 100 mg by mouth at bedtime.          Triage Vitals: BP 126/75  Pulse 90  Temp(Src) 98.4 F (36.9 C) (Oral)  Resp 18  Wt 200 lb (90.719 kg)  SpO2 96%  LMP 02/22/2013  Filed Vitals:   02/25/13 1450 02/25/13 1608  BP: 126/75 115/79  Pulse: 90   Temp: 98.4 F (36.9 C)   TempSrc: Oral   Resp: 18   Weight: 200 lb (90.719 kg)   SpO2: 96%     Physical Exam  Nursing note and vitals reviewed. Constitutional: She is oriented to person, place, and time. She appears well-developed and well-nourished. No distress.  HENT:  Head: Normocephalic and atraumatic.  Right Ear: Tympanic membrane, external ear and ear canal normal.  Left Ear: Tympanic membrane, external ear and ear canal normal.  Nose: Nose normal.  Mouth/Throat: Oropharynx is clear and moist. No oropharyngeal exudate.  TM's gray and translucent bilaterally.  No mastoid or tragal tenderness  bilaterally.  No erythema/exudates to the posterior pharynx.  Uvula midline.  No trismus.    Eyes: Conjunctivae and EOM are normal. Pupils are equal, round, and reactive to light. Right eye exhibits no discharge. Left eye exhibits no discharge.  Neck: Normal range of motion. Neck supple. No tracheal deviation present.  No LAD bilaterally.  No tenderness to palpation to the neck throughout.  No palpable masses/edema to the neck throughout.    Cardiovascular: Normal rate, regular rhythm and normal heart sounds.  Exam reveals no gallop and no friction rub.   No murmur heard. Pulmonary/Chest: Effort normal and breath sounds normal. No respiratory distress. She has no wheezes. She has no rales. She exhibits no tenderness.  Musculoskeletal: Normal range of motion. She exhibits no edema and no tenderness.  Lymphadenopathy:    She has no cervical adenopathy.  Neurological: She is alert and oriented to person, place, and time.  Skin: Skin is warm and dry. She is not diaphoretic.  Psychiatric: She has a normal mood and affect. Her behavior is normal.    ED Course  Procedures (including critical care time)  DIAGNOSTIC STUDIES: Oxygen Saturation is 96% on room air, adequate by my interpretation.    COORDINATION OF CARE: 3:55 PM -Recommend follow up with ENT physician. Patient verbalizes understanding and agrees with treatment plan.   Labs Review Labs Reviewed - No data to display Imaging Review No results found.  EKG Interpretation   None       MDM   Robin Salinas is a 37 y.o. female with a history of diabetes, hypertension and hyperlipidemia who presents to the Emergency Department complaining of constant right ear pain that began two weeks ago.  Will check blood sugar before discharge.      Etiology of right ear pain is unclear.  No evidence of otitis media/externa.  Patient afebrile and non-toxic.  No URI symptoms.  Patient has a hx of vertigo but is asymptomatic  currently.  Patient given referral to ENT.  Patient given return precautions.  Patient in agreement with discharge and plan.    Discharge Medication List as of 02/25/2013  4:01 PM       Final impressions: 1. Otalgia of right ear     Luiz Iron PA-C   I personally performed the services described in this documentation, which was scribed in my presence. The recorded information has been reviewed and is accurate.       Shanda Bumps  Janalyn Rouse, PA-C 02/26/13 2020

## 2013-02-25 NOTE — ED Notes (Signed)
CBG 124 

## 2013-02-25 NOTE — ED Notes (Signed)
Pt reports pain in r/ear radiating down throat x 2 weeks

## 2013-02-26 NOTE — ED Provider Notes (Signed)
Medical screening examination/treatment/procedure(s) were performed by non-physician practitioner and as supervising physician I was immediately available for consultation/collaboration.  EKG Interpretation   None         Caramia Boutin, MD 02/26/13 2136 

## 2014-10-20 ENCOUNTER — Emergency Department (HOSPITAL_COMMUNITY)
Admission: EM | Admit: 2014-10-20 | Discharge: 2014-10-20 | Disposition: A | Discharge status: Home / Self Care | Payer: Managed Care, Other (non HMO) | Attending: Emergency Medicine | Admitting: Emergency Medicine

## 2014-10-20 ENCOUNTER — Emergency Department (HOSPITAL_COMMUNITY): Payer: Managed Care, Other (non HMO)

## 2014-10-20 ENCOUNTER — Encounter (HOSPITAL_COMMUNITY): Payer: Self-pay | Admitting: Emergency Medicine

## 2014-10-20 DIAGNOSIS — Z8659 Personal history of other mental and behavioral disorders: Secondary | ICD-10-CM | POA: Diagnosis not present

## 2014-10-20 DIAGNOSIS — I10 Essential (primary) hypertension: Secondary | ICD-10-CM | POA: Diagnosis not present

## 2014-10-20 DIAGNOSIS — Z79899 Other long term (current) drug therapy: Secondary | ICD-10-CM | POA: Diagnosis not present

## 2014-10-20 DIAGNOSIS — Z3202 Encounter for pregnancy test, result negative: Secondary | ICD-10-CM | POA: Diagnosis not present

## 2014-10-20 DIAGNOSIS — R110 Nausea: Secondary | ICD-10-CM | POA: Insufficient documentation

## 2014-10-20 DIAGNOSIS — R1030 Lower abdominal pain, unspecified: Secondary | ICD-10-CM | POA: Diagnosis present

## 2014-10-20 DIAGNOSIS — R52 Pain, unspecified: Secondary | ICD-10-CM

## 2014-10-20 DIAGNOSIS — Z9049 Acquired absence of other specified parts of digestive tract: Secondary | ICD-10-CM | POA: Diagnosis not present

## 2014-10-20 DIAGNOSIS — R1031 Right lower quadrant pain: Secondary | ICD-10-CM | POA: Diagnosis not present

## 2014-10-20 DIAGNOSIS — E119 Type 2 diabetes mellitus without complications: Secondary | ICD-10-CM | POA: Diagnosis not present

## 2014-10-20 DIAGNOSIS — Z880 Allergy status to penicillin: Secondary | ICD-10-CM | POA: Diagnosis not present

## 2014-10-20 DIAGNOSIS — Z720 Tobacco use: Secondary | ICD-10-CM | POA: Diagnosis not present

## 2014-10-20 LAB — WET PREP, GENITAL
Clue Cells Wet Prep HPF POC: NONE SEEN
TRICH WET PREP: NONE SEEN
Yeast Wet Prep HPF POC: NONE SEEN

## 2014-10-20 LAB — CBG MONITORING, ED: GLUCOSE-CAPILLARY: 153 mg/dL — AB (ref 65–99)

## 2014-10-20 LAB — URINALYSIS, ROUTINE W REFLEX MICROSCOPIC
Bilirubin Urine: NEGATIVE
Hgb urine dipstick: NEGATIVE
KETONES UR: NEGATIVE mg/dL
LEUKOCYTES UA: NEGATIVE
Nitrite: NEGATIVE
PH: 5.5 (ref 5.0–8.0)
PROTEIN: NEGATIVE mg/dL
Specific Gravity, Urine: 1.024 (ref 1.005–1.030)
Urobilinogen, UA: 0.2 mg/dL (ref 0.0–1.0)

## 2014-10-20 LAB — POC URINE PREG, ED: PREG TEST UR: NEGATIVE

## 2014-10-20 LAB — URINE MICROSCOPIC-ADD ON

## 2014-10-20 MED ORDER — DICYCLOMINE HCL 20 MG PO TABS: 20.0000 mg | ORAL_TABLET | Freq: Two times a day (BID) | ORAL | Status: AC

## 2014-10-20 NOTE — ED Notes (Signed)
Pt is alert and orientated.  She understood discharge instructions and follow up appointments.  She does not appear in acute distress.

## 2014-10-20 NOTE — ED Provider Notes (Signed)
CSN: [id number]     Arrival date & time 10/20/14  1025 History   First MD Initiated Contact with Patient 10/20/14 1027     Chief Complaint  Patient presents with  . Abdominal Pain    lower, "pain and pressure" x2 months     (Consider location/radiation/quality/duration/timing/severity/associated sxs/prior Treatment) HPI Robin Salinas is a 39 y.o. female with hx of DM, depression, presents to ED with complaint of abdominal pain for two months, nausea. Patient states pain feels like pressure. States he has been going on for 2 months. Denies any vomiting, denies diarrhea denies urinary symptoms. Patient states "I just want to make sure I'm not pregnant." Patient has not tried any medications for this. She denies any vaginal complaints. No fever, chills. She states the pain comes and goes, independent of any activity or eating.  Past Medical History  Diagnosis Date  . Diabetes mellitus   . Depression   . Hypertension   . Hyperlipidemia    Past Surgical History  Procedure Laterality Date  . Cholecystectomy    . Tubal ligation     Family History  Problem Relation Age of Onset  . Diabetes Other   . Hypertension Other    History  Substance Use Topics  . Smoking status: Current Some Day Smoker -- 0.50 packs/day    Types: Cigarettes  . Smokeless tobacco: Not on file  . Alcohol Use: Yes     Comment: seldom   OB History    No data available     Review of Systems  Constitutional: Negative for fever and chills.  Respiratory: Negative for cough, chest tightness and shortness of breath.   Cardiovascular: Negative for chest pain, palpitations and leg swelling.  Gastrointestinal: Positive for nausea and abdominal pain. Negative for vomiting and diarrhea.  Genitourinary: Positive for pelvic pain. Negative for dysuria, flank pain, vaginal bleeding, vaginal discharge and vaginal pain.  Musculoskeletal: Negative for myalgias, arthralgias, neck pain and neck stiffness.  Skin:  Negative for rash.  Neurological: Negative for dizziness, weakness and headaches.  All other systems reviewed and are negative.     Allergies  Penicillins  Home Medications   Prior to Admission medications   Medication Sig Start Date End Date Taking? Authorizing Provider  Canagliflozin-Metformin HCl (INVOKAMET) [phone] MG TABS Take 1 tablet by mouth 2 (two) times daily.   Yes Historical Provider, MD  diphenhydrAMINE (BENADRYL) 25 mg capsule Take 100 mg by mouth every 6 (six) hours as needed for allergies. For allergy   Yes Historical Provider, MD  glimepiride (AMARYL) 2 MG tablet Take 2 mg by mouth daily with breakfast.   Yes Historical Provider, MD  ibuprofen (ADVIL,MOTRIN) 200 MG tablet Take 800 mg by mouth every 6 (six) hours as needed for fever, headache, mild pain, moderate pain or cramping.    Yes Historical Provider, MD  pravastatin (PRAVACHOL) 20 MG tablet Take 20 mg by mouth daily.   Yes Historical Provider, MD   BP 110/61 mmHg  Pulse 93  Temp(Src) 98.3 F (36.8 C) (Oral)  Resp 16  Ht 5\' 3"  (1.6 m)  Wt 210 lb (95.255 kg)  BMI 37.21 kg/m2  SpO2 94%  LMP 08/02/2014 (Approximate) Physical Exam  Constitutional: She is oriented to person, place, and time. She appears well-developed and well-nourished. No distress.  HENT:  Head: Normocephalic.  Eyes: Conjunctivae are normal.  Neck: Neck supple.  Cardiovascular: Normal rate, regular rhythm and normal heart sounds.   Pulmonary/Chest: Effort normal and breath sounds normal. No  respiratory distress. She has no wheezes. She has no rales.  Abdominal: Soft. Bowel sounds are normal. She exhibits no distension. There is tenderness. There is no rebound.  RLQ tenderness  Genitourinary:  Normal external genitalia. Normal vaginal canal. Small thin white discharge. Cervix is normal, closed. No CMT. No adnexal tenderness or uterine tenderness present. No masses palpated.    Musculoskeletal: She exhibits no edema.  Neurological: She  is alert and oriented to person, place, and time.  Skin: Skin is warm and dry.  Psychiatric: She has a normal mood and affect. Her behavior is normal.  Nursing note and vitals reviewed.   ED Course  Procedures (including critical care time) Labs Review Labs Reviewed  WET PREP, GENITAL - Abnormal; Notable for the following:    WBC, Wet Prep HPF POC FEW (*)    All other components within normal limits  URINALYSIS, ROUTINE W REFLEX MICROSCOPIC (NOT AT [facility]) - Abnormal; Notable for the following:    Glucose, UA >1000 (*)    All other components within normal limits  CBG MONITORING, ED - Abnormal; Notable for the following:    Glucose-Capillary 153 (*)    All other components within normal limits  URINE MICROSCOPIC-ADD ON  POC URINE PREG, ED  GC/CHLAMYDIA PROBE AMP ([place]) NOT AT [facility]    Imaging Review US Transvaginal Non-ob  10/20/2014   CLINICAL DATA:  Two-month history of progressive pelvic pain  EXAM: TRANSABDOMINAL AND TRANSVAGINAL ULTRASOUND OF PELVIS  TECHNIQUE: Study was performed transabdominally to optimize pelvic field of view evaluation and transvaginally to optimize internal visceral architecture evaluation.  COMPARISON:  None  FINDINGS: Uterus  Measurements: 6.2 x 4.2 x 5.6 cm. No fibroids or other mass visualized. There is a small amount of fluid in the endocervical canal. There is a 5 x 4 mm nabothian cyst arising from the cervix.  Endometrium  Diameter: 12 mm. The contour of the endometrium is smooth. A small amount of calcification is noted along the posterior border of the endometrium.  Right ovary:  Measurements: 2.9 x 2.6 x 2.4 cm. There is a dominant follicle arising from the right ovary measuring 1.9 x 1.9 x 1.8 cm. No other right-sided pelvic mass.  Left ovary  Measurements: 1.8 x 1.3 x 1.7 cm. There is a dominant follicle measuring 1.4 x 1.2 x 1.1 cm. No other right-sided pelvic mass.  Other findings  No free fluid.  IMPRESSION: There is a dominant follicle in each  ovary. No other extrauterine pelvic or adnexal masses. No free pelvic fluid. A small amount of calcification is noted along the posterior wall of the endometrium. The significance of this finding is uncertain. This finding could represent residua of previous infection.  A small amount of fluid is noted in the cervix, a finding of questionable etiology. Small nabothian cyst noted in cervix. No myometrial lesions identified.   Electronically Signed   By: Bretta Bang III M.D.   On: 10/20/2014 13:59   US Pelvis Complete  10/20/2014   CLINICAL DATA:  Two-month history of progressive pelvic pain  EXAM: TRANSABDOMINAL AND TRANSVAGINAL ULTRASOUND OF PELVIS  TECHNIQUE: Study was performed transabdominally to optimize pelvic field of view evaluation and transvaginally to optimize internal visceral architecture evaluation.  COMPARISON:  None  FINDINGS: Uterus  Measurements: 6.2 x 4.2 x 5.6 cm. No fibroids or other mass visualized. There is a small amount of fluid in the endocervical canal. There is a 5 x 4 mm nabothian cyst arising from the cervix.  Endometrium  Diameter: 12 mm. The contour of the endometrium is smooth. A small amount of calcification is noted along the posterior border of the endometrium.  Right ovary:  Measurements: 2.9 x 2.6 x 2.4 cm. There is a dominant follicle arising from the right ovary measuring 1.9 x 1.9 x 1.8 cm. No other right-sided pelvic mass.  Left ovary  Measurements: 1.8 x 1.3 x 1.7 cm. There is a dominant follicle measuring 1.4 x 1.2 x 1.1 cm. No other right-sided pelvic mass.  Other findings  No free fluid.  IMPRESSION: There is a dominant follicle in each ovary. No other extrauterine pelvic or adnexal masses. No free pelvic fluid. A small amount of calcification is noted along the posterior wall of the endometrium. The significance of this finding is uncertain. This finding could represent residua of previous infection.  A small amount of fluid is noted in the cervix, a finding of  questionable etiology. Small nabothian cyst noted in cervix. No myometrial lesions identified.   Electronically Signed   By: Bretta BangWilliam  Woodruff III M.D.   On: 10/20/2014 13:59     EKG Interpretation None      MDM   Final diagnoses:  Lower abdominal pain    Patient with intermittent pelvic pain for 2 months, nausea, more if she may be pregnant. Urine pregnancy test is negative. Patient denies any associated symptoms, no changes in bowels, no vomiting, no fever or chills, no dysuria, vaginal discharge or bleeding. Pelvic exam is unremarkable except for tenderness of the uterus. Will get ultrasound to rule out ovarian cyst versus fibroids.   2:52 PM Patient's pelvic exam unremarkable, ultrasound with no significant findings. Patient's pain is intermittent for 2 months. She currently does not appear to be in any distress. Vital signs are normal. She'll need further follow-up with primary care doctor regarding this pain. At this time pt stable for outpatient follow up. Return precautions discussed.   Filed Vitals:   10/20/14 1033 10/20/14 1403 10/20/14 1511  BP: 165/97 110/61 112/69  Pulse: 111 93 91  Temp: 99 F (37.2 C) 98.3 F (36.8 C) 98 F (36.7 C)  TempSrc: Oral Oral Oral  Resp: 16 16 18   Height: 5\' 3"  (1.6 m)    Weight: 210 lb (95.255 kg)    SpO2: 96% 94% 97%      Jaynie Crumbleatyana Keyanna Sandefer, PA-C 10/20/14 1520  Tilden FossaElizabeth Rees, MD 10/20/14 1537

## 2014-10-20 NOTE — Discharge Instructions (Signed)
Bentyl as prescribed as needed for bowel spasms. Please follow up with primary care doctor for further evaluation of abdominal pain. Return if worsening.    Abdominal Pain, Women Abdominal (stomach, pelvic, or belly) pain can be caused by many things. It is important to tell your doctor:  The location of the pain.  Does it come and go or is it present all the time?  Are there things that start the pain (eating certain foods, exercise)?  Are there other symptoms associated with the pain (fever, nausea, vomiting, diarrhea)? All of this is helpful to know when trying to find the cause of the pain. CAUSES   Stomach: virus or bacteria infection, or ulcer.  Intestine: appendicitis (inflamed appendix), regional ileitis (Crohn's disease), ulcerative colitis (inflamed colon), irritable bowel syndrome, diverticulitis (inflamed diverticulum of the colon), or cancer of the stomach or intestine.  Gallbladder disease or stones in the gallbladder.  Kidney disease, kidney stones, or infection.  Pancreas infection or cancer.  Fibromyalgia (pain disorder).  Diseases of the female organs:  Uterus: fibroid (non-cancerous) tumors or infection.  Fallopian tubes: infection or tubal pregnancy.  Ovary: cysts or tumors.  Pelvic adhesions (scar tissue).  Endometriosis (uterus lining tissue growing in the pelvis and on the pelvic organs).  Pelvic congestion syndrome (female organs filling up with blood just before the menstrual period).  Pain with the menstrual period.  Pain with ovulation (producing an egg).  Pain with an IUD (intrauterine device, birth control) in the uterus.  Cancer of the female organs.  Functional pain (pain not caused by a disease, may improve without treatment).  Psychological pain.  Depression. DIAGNOSIS  Your doctor will decide the seriousness of your pain by doing an examination.  Blood tests.  X-rays.  Ultrasound.  CT scan (computed tomography, special  type of X-ray).  MRI (magnetic resonance imaging).  Cultures, for infection.  Barium enema (dye inserted in the large intestine, to better view it with X-rays).  Colonoscopy (looking in intestine with a lighted tube).  Laparoscopy (minor surgery, looking in abdomen with a lighted tube).  Major abdominal exploratory surgery (looking in abdomen with a large incision). TREATMENT  The treatment will depend on the cause of the pain.   Many cases can be observed and treated at home.  Over-the-counter medicines recommended by your caregiver.  Prescription medicine.  Antibiotics, for infection.  Birth control pills, for painful periods or for ovulation pain.  Hormone treatment, for endometriosis.  Nerve blocking injections.  Physical therapy.  Antidepressants.  Counseling with a psychologist or psychiatrist.  Minor or major surgery. HOME CARE INSTRUCTIONS   Do not take laxatives, unless directed by your caregiver.  Take over-the-counter pain medicine only if ordered by your caregiver. Do not take aspirin because it can cause an upset stomach or bleeding.  Try a clear liquid diet (broth or water) as ordered by your caregiver. Slowly move to a bland diet, as tolerated, if the pain is related to the stomach or intestine.  Have a thermometer and take your temperature several times a day, and record it.  Bed rest and sleep, if it helps the pain.  Avoid sexual intercourse, if it causes pain.  Avoid stressful situations.  Keep your follow-up appointments and tests, as your caregiver orders.  If the pain does not go away with medicine or surgery, you may try:  Acupuncture.  Relaxation exercises (yoga, meditation).  Group therapy.  Counseling. SEEK MEDICAL CARE IF:   You notice certain foods cause stomach pain.  Your home care treatment is not helping your pain.  You need stronger pain medicine.  You want your IUD removed.  You feel faint or  lightheaded.  You develop nausea and vomiting.  You develop a rash.  You are having side effects or an allergy to your medicine. SEEK IMMEDIATE MEDICAL CARE IF:   Your pain does not go away or gets worse.  You have a fever.  Your pain is felt only in portions of the abdomen. The right side could possibly be appendicitis. The left lower portion of the abdomen could be colitis or diverticulitis.  You are passing blood in your stools (bright red or black tarry stools, with or without vomiting).  You have blood in your urine.  You develop chills, with or without a fever.  You pass out. MAKE SURE YOU:   Understand these instructions.  Will watch your condition.  Will get help right away if you are not doing well or get worse. Document Released: 01/22/2007 Document Revised: 08/11/2013 Document Reviewed: 02/11/2009 [facility] Patient Information 2015 [place], [place]. This information is not intended to replace advice given to you by your health care provider. Make sure you discuss any questions you have with your health care provider.

## 2014-10-20 NOTE — ED Notes (Signed)
Pt is being transferred to [place]

## 2014-10-20 NOTE — ED Notes (Signed)
Doctor at patient's bedside

## 2014-10-20 NOTE — ED Notes (Addendum)
Pt A+Ox4, pt reports lower abd "pain and pressure" x2 months.  Pt reports +nausea "always have nausea since I'm diabetic".  Pt reports tolerating PO at baseline.  Pt denies vomiting, diarrhea, constipation, dysuria.  Pt denies fevers/chills.  Skin PWD.  MAEI, ambulatory with steady gait.  Speaking full/clear sentences, rr even/un-lab.  Well appearing.  Pt sts "wondering if I might be pregnant", pt sts "tubes clamped not tied".  NAD.

## 2014-10-21 LAB — GC/CHLAMYDIA PROBE AMP (~~LOC~~) NOT AT ARMC
CHLAMYDIA, DNA PROBE: NEGATIVE
Neisseria Gonorrhea: NEGATIVE

## 2014-10-22 ENCOUNTER — Ambulatory Visit (INDEPENDENT_AMBULATORY_CARE_PROVIDER_SITE_OTHER): Payer: Managed Care, Other (non HMO) | Admitting: Obstetrics and Gynecology

## 2014-10-22 ENCOUNTER — Telehealth: Payer: Self-pay | Admitting: *Deleted

## 2014-10-22 ENCOUNTER — Encounter: Payer: Self-pay | Admitting: Obstetrics and Gynecology

## 2014-10-22 VITALS — BP 128/78 | HR 120 | Resp 18 | Ht 61.5 in | Wt 210.0 lb

## 2014-10-22 DIAGNOSIS — R000 Tachycardia, unspecified: Secondary | ICD-10-CM | POA: Diagnosis not present

## 2014-10-22 DIAGNOSIS — Z01419 Encounter for gynecological examination (general) (routine) without abnormal findings: Secondary | ICD-10-CM

## 2014-10-22 DIAGNOSIS — E785 Hyperlipidemia, unspecified: Secondary | ICD-10-CM | POA: Insufficient documentation

## 2014-10-22 DIAGNOSIS — Z124 Encounter for screening for malignant neoplasm of cervix: Secondary | ICD-10-CM

## 2014-10-22 DIAGNOSIS — J45909 Unspecified asthma, uncomplicated: Secondary | ICD-10-CM | POA: Insufficient documentation

## 2014-10-22 DIAGNOSIS — I10 Essential (primary) hypertension: Secondary | ICD-10-CM | POA: Insufficient documentation

## 2014-10-22 DIAGNOSIS — F32A Depression, unspecified: Secondary | ICD-10-CM | POA: Insufficient documentation

## 2014-10-22 DIAGNOSIS — N762 Acute vulvitis: Secondary | ICD-10-CM | POA: Diagnosis not present

## 2014-10-22 DIAGNOSIS — F329 Major depressive disorder, single episode, unspecified: Secondary | ICD-10-CM | POA: Insufficient documentation

## 2014-10-22 MED ORDER — BETAMETHASONE VALERATE 0.1 % EX OINT: TOPICAL_OINTMENT | CUTANEOUS | Status: AC

## 2014-10-22 NOTE — Telephone Encounter (Signed)
I left patient in regards to following up with her PCP on her tachycardia. -eh

## 2014-10-22 NOTE — Telephone Encounter (Signed)
I spoke with patient and advised her to follow up with her PCP in regards to tachycardia. -eh

## 2014-10-22 NOTE — Patient Instructions (Signed)

## 2014-10-22 NOTE — Progress Notes (Signed)
Patient ID: Robin Salinas, female   DOB: [birth year], 39 y.o.   MRN: [id number] 39 y.o. G14P1102 Married Caucasian female here for annual exam.  Patient was in the ED two days ago for lower abdominal pain - PUS/ Transvaginal U/S done. Ultrasound was negative, labs, genprobe all negative. Negative UPT. Etiology of pain unclear. The pain has been intermittent, worse with movement. She is wondering if the pain is from her "pinched nerve" in her back, hurts more to move, okay if she is still. More activity, more pain. Taking ibuprofen, not really helping. She was given a script for bentyl, hasn't gotten it filled yet.  Menses q month x 8 (always). Saturates a super tampon in 3 hours. No BTB. Variable dysmenorrhea. She gets episodes of headaches with her cycle, can be migraines. Sexually active, tubal ligation for contraception. No dyspareunia.  Has diabetes, on medications. Last HgbA1C around 8. On questioning during her exam, she c/o genital pruritus. It comes and goes, has been bothersome lately. She thinks it is related to her glucose control. PCP:  No PCP    Patient's last menstrual period was 10/04/2014.          Sexually active: Yes.    The current method of family planning is tubal ligation.    Exercising: Yes.    walking, swimming and cutting the grass Smoker:  yes  Health Maintenance:  Pap:  11 years ago History of abnormal Pap:  Yes- repeated PAP 6 months later and it was normal MMG:  Never  Colonoscopy:  Never  BMD:   N/A TDaP:  2013    reports that she has been smoking Cigarettes.  She has been smoking about 0.50 packs per day. She has never used smokeless tobacco. She reports that she drinks alcohol. She reports that she does not use illicit drugs.  She states she typically smokes one cigarette a day, can go weeks in between smoking. More when she is stressed.   Past Medical History  Diagnosis Date  . Diabetes mellitus   . Depression   . Hypertension   . Hyperlipidemia      Past Surgical History  Procedure Laterality Date  . Cholecystectomy    . Tubal ligation      Current Outpatient Prescriptions  Medication Sig Dispense Refill  . Canagliflozin-Metformin HCl (INVOKAMET) [phone] MG TABS Take 1 tablet by mouth 2 (two) times daily.    . diphenhydrAMINE (BENADRYL) 25 mg capsule Take 100 mg by mouth every 6 (six) hours as needed for allergies. For allergy    . fluconazole (DIFLUCAN) 150 MG tablet     . glimepiride (AMARYL) 2 MG tablet Take 2 mg by mouth daily with breakfast.    . ibuprofen (ADVIL,MOTRIN) 200 MG tablet Take 800 mg by mouth every 6 (six) hours as needed for fever, headache, mild pain, moderate pain or cramping.     . pravastatin (PRAVACHOL) 20 MG tablet Take 20 mg by mouth daily.    Marland Kitchen dicyclomine (BENTYL) 20 MG tablet Take 1 tablet (20 mg total) by mouth 2 (two) times daily. (Patient not taking: Reported on 10/22/2014) 20 tablet 0   No current facility-administered medications for this visit.   Working on her bachelors degree in Pension scheme manager  Family History  Problem Relation Age of Onset  . Diabetes Other   . Hypertension Other   . Depression Mother   . Anxiety disorder Mother   . Heart failure Father   . Diabetes Father   . Depression  Maternal Grandmother   . Diabetes Maternal Grandmother   . Hypertension Maternal Grandmother   . Depression Son   . Anxiety disorder Son     ROS:  Pertinent items are noted in HPI.  Otherwise, a comprehensive ROS was negative.  Exam:   BP 128/78 mmHg  Pulse 120  Resp 18  Ht 5' 1.5" (1.562 m)  Wt 210 lb (95.255 kg)  BMI 39.04 kg/m2  LMP 10/04/2014    General appearance: alert, cooperative and appears stated age Head: Normocephalic, without obvious abnormality, atraumatic Neck: no adenopathy, supple, symmetrical, trachea midline and thyroid normal to inspection and palpation Lungs: clear to auscultation bilaterally Breasts: normal appearance, no masses or tenderness Heart: tachycardic,  110 BPM, normal rhythm, no murmurs Abdomen: soft, non-tender; bowel sounds normal; no masses,  no organomegaly Extremities: extremities normal, atraumatic, no cyanosis or edema Skin: Skin color, texture, turgor normal. No rashes or lesions Lymph nodes: Cervical, supraclavicular, and axillary nodes normal. No abnormal inguinal nodes palpated Neurologic: Grossly normal  Pelvic: External genitalia:  Area a thickening and whitening above and around the clitoris, small fissure seen, no agglutination.               Urethra:  normal appearing urethra with no masses, tenderness or lesions              Bartholins and Skenes: normal                 Vagina: normal appearing vagina with normal color and discharge, no lesions              Cervix: no lesions              Pap taken: Yes.   Bimanual Exam:  Uterus:  normal size, contour, position, consistency, mobility, non-tender              Adnexa: normal adnexa and no mass, fullness, tenderness              Rectovaginal: Yes.  .  Confirms.              Anus:  normal sphincter tone, no lesions  Chaperone was present for exam.  Wet prep: no clue, no trich, +WBC KOH: no yeast PH: 4  Assessment:   Well woman visit with normal exam. Vulvitis, negative vaginal slides Smoker Multiple medical problems, followed by endocrinology Tachycardia, patient states this is not uncommon for her  Plan: Yearly mammogram recommended after age 38.  Recommended self breast exam.  Pap and HR HPV as above. Will treat vulva with steroid ointment, f/u in 2 weeks Discussed  regular exercise program including cardiovascular and weight bearing exercise. Discussed weight loss and quitting smoking Recommend f/u with Primary (her endocrinologist for her tachycardia)    After visit summary provided.     Cc: primary MD

## 2014-10-26 ENCOUNTER — Other Ambulatory Visit: Payer: Self-pay | Admitting: Obstetrics and Gynecology

## 2014-10-26 DIAGNOSIS — Z124 Encounter for screening for malignant neoplasm of cervix: Secondary | ICD-10-CM

## 2014-10-26 NOTE — Addendum Note (Signed)
Addended by: Tobi BastosJERTSON, Ranier Coach E on: 10/26/2014 03:03 PM   Modules accepted: Orders

## 2014-10-28 LAB — IPS PAP TEST WITH HPV

## 2014-11-05 ENCOUNTER — Encounter: Payer: Self-pay | Admitting: Obstetrics and Gynecology

## 2014-11-05 ENCOUNTER — Ambulatory Visit (INDEPENDENT_AMBULATORY_CARE_PROVIDER_SITE_OTHER): Payer: Managed Care, Other (non HMO) | Admitting: Obstetrics and Gynecology

## 2014-11-05 ENCOUNTER — Ambulatory Visit: Payer: Managed Care, Other (non HMO) | Admitting: Obstetrics and Gynecology

## 2014-11-05 VITALS — BP 122/84 | HR 88 | Resp 14 | Wt 210.6 lb

## 2014-11-05 DIAGNOSIS — N762 Acute vulvitis: Secondary | ICD-10-CM

## 2014-11-05 NOTE — Progress Notes (Signed)
GYNECOLOGY  VISIT   HPI: 39 y.o.   Married  Caucasian  female   G2P1102 with Patient's last menstrual period was 10/04/2014 (exact date).   here for  2 week recheck Vulvitis. She was started on steroid ointment 2 weeks ago, feeling better, still a little itchy at times. Abdominal pain she had at her last visit is better.   GYNECOLOGIC HISTORY: Patient's last menstrual period was 10/04/2014 (exact date). Contraception: tubal ligation Menopausal hormone therapy: n/a Last mammogram:  never Last pap smear: 10/26/14 wnl HR, HPV        OB History    Gravida Para Term Preterm AB TAB SAB Ectopic Multiple Living   Patient Active Problem List   Diagnosis Date Noted  . 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 Clinical depression 10/22/2014  . HLD (hyperlipidemia) 10/22/2014  . BP (high blood pressure) 10/22/2014  . Diabetes mellitus, type 2 08/02/2012    Past Medical History  Diagnosis Date  . Diabetes mellitus   . Depression   . Hypertension   . Hyperlipidemia     Past Surgical History  Procedure Laterality Date  . Cholecystectomy    . Tubal ligation      Current Outpatient Prescriptions  Medication Sig Dispense Refill  . betamethasone valerate ointment (VALISONE) 0.1 % Apply a pea sized amount to the affected area 2 x a day x 2 weeks 15 g 0  . Canagliflozin-Metformin HCl (INVOKAMET) [phone] MG TABS Take 1 tablet by mouth 2 (two) times daily.    Marland Kitchen dicyclomine (BENTYL) 20 MG tablet Take 1 tablet (20 mg total) by mouth 2 (two) times daily. 20 tablet 0  . diphenhydrAMINE (BENADRYL) 25 mg capsule Take 100 mg by mouth every 6 (six) hours as needed for allergies. For allergy    . fluconazole (DIFLUCAN) 150 MG tablet     . glimepiride (AMARYL) 2 MG tablet Take 2 mg by mouth daily with breakfast.    . ibuprofen (ADVIL,MOTRIN) 200 MG tablet Take 800 mg by mouth every 6 (six) hours as needed for fever, headache, mild pain, moderate pain or cramping.     .  pravastatin (PRAVACHOL) 20 MG tablet Take 20 mg by mouth daily.     No current facility-administered medications for this visit.     ALLERGIES: Penicillins  Family History  Problem Relation Age of Onset  . Diabetes Other   . Hypertension Other   . Depression Mother   . Anxiety disorder Mother   . Heart failure Father   . Diabetes Father   . Depression Maternal Grandmother   . Diabetes Maternal Grandmother   . Hypertension Maternal Grandmother   . Depression Son   . Anxiety disorder Son     History   Social History  . Marital Status: Married    Spouse Name: N/A  . Number of Children: N/A  . Years of Education: N/A   Occupational History  . Not on file.   Social History Main Topics  . Smoking status: Current Some Day Smoker -- 0.50 packs/day    Types: Cigarettes  . Smokeless tobacco: Never Used  . Alcohol Use: 0.0 oz/week    0 Standard drinks or equivalent per week     Comment: seldom  . Drug Use: No  . Sexual Activity:    Partners: Male    Birth Control/ Protection: None, Surgical   Other Topics Concern  .  Not on file   Social History Narrative    ROS:  Pertinent items are noted in HPI.  PHYSICAL EXAMINATION:    BP 122/84 mmHg  Pulse 88  Resp 14  Wt 210 lb 9.6 oz (95.528 kg)  LMP 10/04/2014 (Exact Date)    General appearance: alert, cooperative and appears stated age  Pelvic: External genitalia:  no lesions, marked improvement in whitening of the vulvar skin, only a minimal whitening on the right side of the clitoris. No agglutination, no fissures, no erythema.              Urethra:  normal appearing urethra with no masses, tenderness or lesions              Bartholins and Skenes: normal                  Chaperone was present for exam.  ASSESSMENT   vulvitis, improved  PLAN  Reviewed vulvar skin care Call with any concerns

## 2016-11-16 ENCOUNTER — Emergency Department (HOSPITAL_COMMUNITY): Payer: Worker's Compensation

## 2016-11-16 ENCOUNTER — Encounter (HOSPITAL_COMMUNITY): Payer: Self-pay | Admitting: Emergency Medicine

## 2016-11-16 ENCOUNTER — Emergency Department (HOSPITAL_COMMUNITY)
Admission: EM | Admit: 2016-11-16 | Discharge: 2016-11-16 | Disposition: A | Discharge status: Home / Self Care | Payer: Worker's Compensation | Attending: Emergency Medicine | Admitting: Emergency Medicine

## 2016-11-16 DIAGNOSIS — Z79899 Other long term (current) drug therapy: Secondary | ICD-10-CM | POA: Diagnosis not present

## 2016-11-16 DIAGNOSIS — I10 Essential (primary) hypertension: Secondary | ICD-10-CM | POA: Insufficient documentation

## 2016-11-16 DIAGNOSIS — Z87891 Personal history of nicotine dependence: Secondary | ICD-10-CM | POA: Insufficient documentation

## 2016-11-16 DIAGNOSIS — Z7984 Long term (current) use of oral hypoglycemic drugs: Secondary | ICD-10-CM | POA: Diagnosis not present

## 2016-11-16 DIAGNOSIS — E119 Type 2 diabetes mellitus without complications: Secondary | ICD-10-CM | POA: Diagnosis not present

## 2016-11-16 DIAGNOSIS — M25531 Pain in right wrist: Secondary | ICD-10-CM | POA: Diagnosis present

## 2016-11-16 DIAGNOSIS — X500XXA Overexertion from strenuous movement or load, initial encounter: Secondary | ICD-10-CM | POA: Insufficient documentation

## 2016-11-16 MED ORDER — IBUPROFEN 600 MG PO TABS: 600.0000 mg | ORAL_TABLET | Freq: Four times a day (QID) | ORAL | 0 refills | Status: AC | PRN

## 2016-11-16 MED ORDER — TRAMADOL HCL 50 MG PO TABS: 50.0000 mg | ORAL_TABLET | Freq: Four times a day (QID) | ORAL | 0 refills | Status: AC | PRN

## 2016-11-16 NOTE — ED Triage Notes (Signed)
Pain to rt wrist, started Tuesday after lifting a box at work.

## 2016-11-16 NOTE — ED Notes (Signed)
Patient transported to X-ray 

## 2016-11-16 NOTE — ED Provider Notes (Signed)
[facility]-EMERGENCY DEPT Provider Note   CSN: [id number] Arrival date & time: 11/16/16  1449     History   Chief Complaint Chief Complaint  Patient presents with  . Wrist Pain    HPI Robin Salinas is a 41 y.o. female.  HPI   Robin Salinas is a 41 y.o. female who presents to the Emergency Department complaining of right wrist pain for two days.  She states that she picked up a box at work and felt a "pop" in her wrist that has been continuous since.  She describes a throbbing pain to her wrist that radiates up her arm.  Pain is worse with movement.  She has tried ice and ibuprofen without relief.  No numbness or swelling.  Denies fall   Past Medical History:  Diagnosis Date  . Depression   . Diabetes mellitus   . Hyperlipidemia   . Hypertension     Patient Active Problem List   Diagnosis Date Noted  . 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 Clinical depression 10/22/2014  . HLD (hyperlipidemia) 10/22/2014  . BP (high blood pressure) 10/22/2014  . Diabetes mellitus, type 2 (HCC) 08/02/2012    Past Surgical History:  Procedure Laterality Date  . CHOLECYSTECTOMY    . TUBAL LIGATION      OB History    Gravida Para Term Preterm AB Living   2 2 1 1   2    SAB TAB Ectopic Multiple Live Births           2       Home Medications    Prior to Admission medications   Medication Sig Start Date End Date Taking? Authorizing Provider  betamethasone valerate ointment (VALISONE) 0.1 % Apply a pea sized amount to the affected area 2 x a day x 2 weeks 10/22/14   Romualdo BolkJertson, Jill Evelyn, MD  Canagliflozin-Metformin HCl (INVOKAMET) [phone] MG TABS Take 1 tablet by mouth 2 (two) times daily.    [provider]  dicyclomine (BENTYL) 20 MG tablet Take 1 tablet (20 mg total) by mouth 2 (two) times daily. 10/20/14   Kirichenko, Lemont Fillersatyana, PA-C  diphenhydrAMINE (BENADRYL) 25 mg capsule Take 100 mg by mouth every 6 (six) hours as needed for allergies. For allergy     [provider]  fluconazole (DIFLUCAN) 150 MG tablet  08/21/14   [provider]  glimepiride (AMARYL) 2 MG tablet Take 2 mg by mouth daily with breakfast.    [provider]  ibuprofen (ADVIL,MOTRIN) 600 MG tablet Take 1 tablet (600 mg total) by mouth every 6 (six) hours as needed. 11/16/16   Keeton Kassebaum, PA-C  pravastatin (PRAVACHOL) 20 MG tablet Take 20 mg by mouth daily.    [provider]  traMADol (ULTRAM) 50 MG tablet Take 1 tablet (50 mg total) by mouth every 6 (six) hours as needed for moderate pain. 11/16/16   Pauline Ausriplett, Burley [place], PA-C    Family History Family History  Problem Relation Age of Onset  . Diabetes Other   . Hypertension Other   . Depression Mother   . Anxiety disorder Mother   . Heart failure Father   . Diabetes Father   . Depression Maternal Grandmother   . Diabetes Maternal Grandmother   . Hypertension Maternal Grandmother   . Depression Son   . Anxiety disorder Son     Social History Social History  Substance Use Topics  . Smoking status: Former Smoker    Packs/day: 0.50    Types: Cigarettes  .  Smokeless tobacco: Never Used  . Alcohol use 0.0 oz/week     Comment: seldom     Allergies   Penicillins   Review of Systems Review of Systems  Constitutional: Negative for chills and fever.  Genitourinary: Negative for difficulty urinating and dysuria.  Musculoskeletal: Positive for arthralgias (right wrist pain). Negative for joint swelling and neck pain.  Skin: Negative for color change and wound.  Neurological: Negative for weakness and numbness.  All other systems reviewed and are negative.    Physical Exam Updated Vital Signs BP 138/83 (BP Location: Left Arm)   Pulse 98   Temp 98.7 F (37.1 C) (Oral)   Resp 17   Ht 5\' 3"  (1.6 m)   Wt 95.3 kg (210 lb)   LMP 11/14/2016   SpO2 99%   BMI 37.20 kg/m   Physical Exam  Constitutional: She is oriented to person, place, and time. She appears well-developed  and well-nourished. No distress.  HENT:  Head: Normocephalic and atraumatic.  Cardiovascular: Normal rate, regular rhythm and normal heart sounds.   Pulmonary/Chest: Effort normal and breath sounds normal.  Musculoskeletal: She exhibits tenderness. She exhibits no edema or deformity.  Diffuse ttp of the distal right wrist, pain worse laterally.  No erythema or edema.  CR< 2 sec.  No bruising or bony deformity.  Patient has full ROM. Compartments soft.  Neurological: She is alert and oriented to person, place, and time. She exhibits normal muscle tone. Coordination normal.  Skin: Skin is warm and dry. Capillary refill takes less than 2 seconds.  Nursing note and vitals reviewed.    ED Treatments / Results  Labs (all labs ordered are listed, but only abnormal results are displayed) Labs Reviewed - No data to display  EKG  EKG Interpretation None       Radiology Dg Wrist Complete Right  Result Date: 11/16/2016 CLINICAL DATA:  Wrist pain after popping sensation while lifting a box EXAM: RIGHT WRIST - COMPLETE 3+ VIEW COMPARISON:  Wrist radiograph 09/02/2011 FINDINGS: There is no evidence of fracture or dislocation. There is no evidence of arthropathy or other focal bone abnormality. Soft tissues are unremarkable. IMPRESSION: No acute abnormality. Electronically Signed   By: Deatra Robinson M.D.   On: 11/16/2016 15:24    Procedures Procedures (including critical care time)  Medications Ordered in ED Medications - No data to display   Initial Impression / Assessment and Plan / ED Course  I have reviewed the triage vital signs and the nursing notes.  Pertinent labs & imaging results that were available during my care of the patient were reviewed by me and considered in my medical decision making (see chart for details).     Pt with pain to wrist after picking up a box.  NV intact.  No erythema or edema.  Wrist splint applied. Pt prefers to f/u with local orthopedics.  Referral info  given for Dr. Romeo Apple  Final Clinical Impressions(s) / ED Diagnoses   Final diagnoses:  Acute pain of right wrist    New Prescriptions Discharge Medication List as of 11/16/2016  3:47 PM    START taking these medications   Details  traMADol (ULTRAM) 50 MG tablet Take 1 tablet (50 mg total) by mouth every 6 (six) hours as needed for moderate pain., Starting Thu 11/16/2016, [employer], [place], PA-C 11/16/16 1615    Mancel Bale, MD 11/16/16 [phone]

## 2016-11-16 NOTE — Discharge Instructions (Signed)
Elevate and apply ice packs on/off to your wrist.  Call Dr. Mort SawyersHarrison's office to arrange a follow-up appt.

## 2017-11-15 IMAGING — DX DG WRIST COMPLETE 3+V*R*
4 series · 4 of 4 positions shown · non-contrast
Comparison: Wrist radiograph 09/02/2011

CLINICAL DATA: Wrist pain after popping sensation while lifting a
box

EXAM:
RIGHT WRIST - COMPLETE 3+ VIEW

[wrist pa]
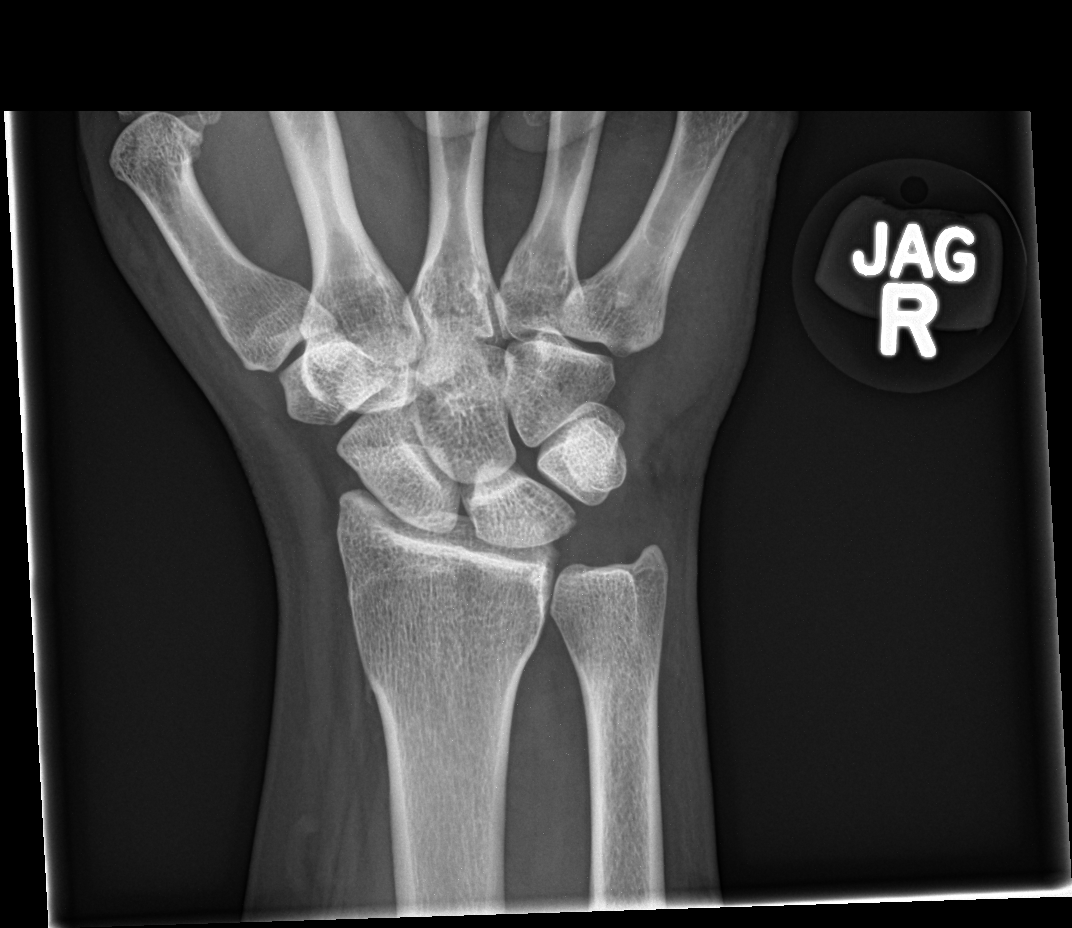

[wrist navicular]
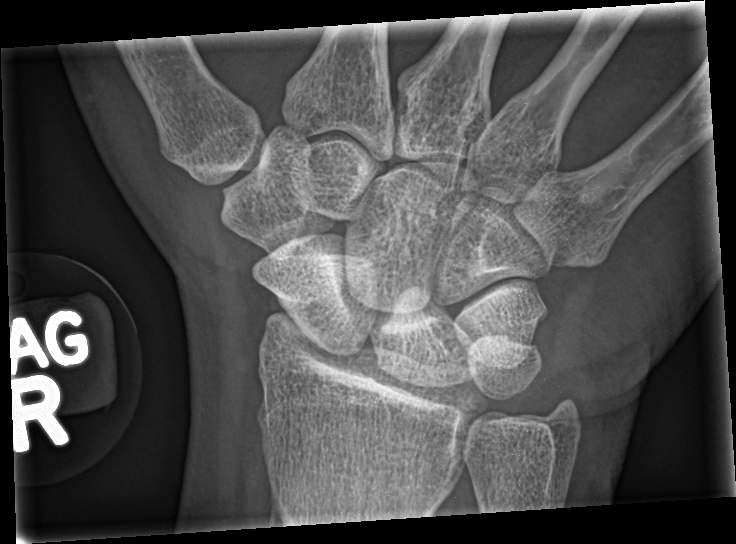

[wrist obl]
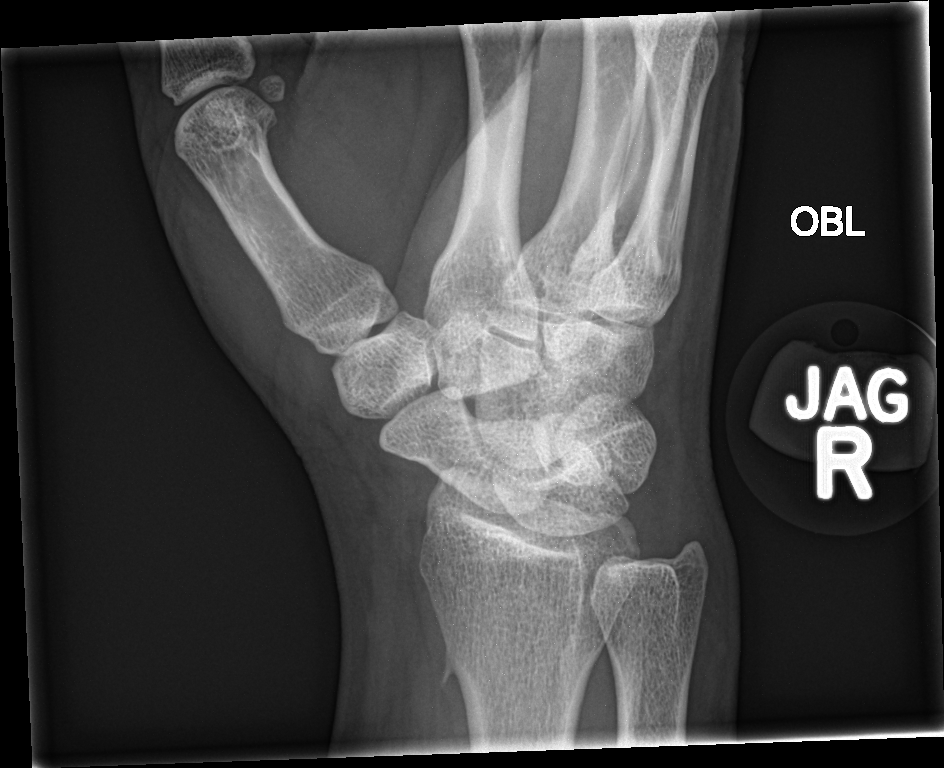

[wrist lat]
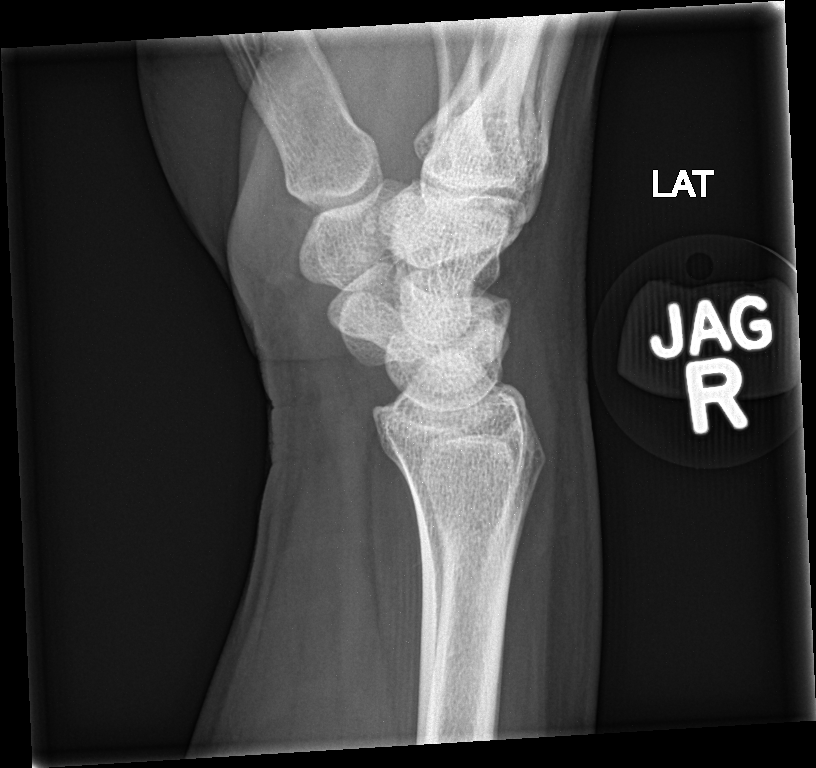

[4 of 4 positions shown; findings below may reference images not displayed]

FINDINGS: There is no evidence of fracture or dislocation. There is no
evidence of arthropathy or other focal bone abnormality. Soft
tissues are unremarkable.
IMPRESSION: No acute abnormality.
